# Patient Record
Sex: Female | Born: 1940 | Race: White | Hispanic: No | Marital: Married | Smoking: Current every day smoker
Health system: Southern US, Community
[De-identification: ages and names within clinical notes are randomized; demographics above are authoritative.]

## PROBLEM LIST (undated history)

## (undated) DIAGNOSIS — Z972 Presence of dental prosthetic device (complete) (partial): Secondary | ICD-10-CM

## (undated) DIAGNOSIS — J40 Bronchitis, not specified as acute or chronic: Secondary | ICD-10-CM

---

## 1972-07-07 HISTORY — PX: BREAST ENHANCEMENT SURGERY: SHX7

## 1996-07-07 HISTORY — PX: FACIAL COSMETIC SURGERY: SHX629

## 2008-12-19 ENCOUNTER — Ambulatory Visit: Payer: Self-pay | Admitting: Family Medicine

## 2016-10-15 ENCOUNTER — Telehealth: Payer: Self-pay

## 2016-10-15 ENCOUNTER — Other Ambulatory Visit: Payer: Self-pay

## 2016-10-15 ENCOUNTER — Telehealth: Payer: Self-pay | Admitting: Gastroenterology

## 2016-10-15 DIAGNOSIS — Z1211 Encounter for screening for malignant neoplasm of colon: Secondary | ICD-10-CM

## 2016-10-15 NOTE — Telephone Encounter (Signed)
10/15/16 Spoke with Gavin Pound. At [facility] and NO prior Josem Kaufmann is required for Screening Colonoscopy [phone] / Z12.11 for Dr. Allen Norris at [facility] Ref # [id number].

## 2016-10-15 NOTE — Telephone Encounter (Signed)
Gastroenterology Pre-Procedure Review  Request Date: 10/27/16 Requesting Physician: Dr. Allen Norris  PATIENT REVIEW QUESTIONS: The patient responded to the following health history questions as indicated:    1. Are you having any GI issues? yes (Blood in stool) 2. Do you have a personal history of Polyps? no 3. Do you have a family history of Colon Cancer or Polyps? no 4. Diabetes Mellitus? no 5. Joint replacements in the past 12 months?no 6. Major health problems in the past 3 months?no 7. Any artificial heart valves, MVP, or defibrillator?no    MEDICATIONS & ALLERGIES:    Patient reports the following regarding taking any anticoagulation/antiplatelet therapy:   Plavix, Coumadin, Eliquis, Xarelto, Lovenox, Pradaxa, Brilinta, or Effient? no Aspirin? no  Patient confirms/reports the following medications:  No current outpatient prescriptions on file.   No current facility-administered medications for this visit.     Patient confirms/reports the following allergies:  Allergies not on file  No orders of the defined types were placed in this encounter.   AUTHORIZATION INFORMATION Primary Insurance: 1D#: Group #:  Secondary Insurance: 1D#: Group #:  SCHEDULE INFORMATION: Date: 4/23 Time: Location: [place]

## 2016-10-16 DIAGNOSIS — J40 Bronchitis, not specified as acute or chronic: Secondary | ICD-10-CM

## 2016-10-16 HISTORY — DX: Bronchitis, not specified as acute or chronic: J40

## 2016-10-22 ENCOUNTER — Encounter: Payer: Self-pay | Admitting: *Deleted

## 2016-10-24 NOTE — Discharge Instructions (Signed)

## 2016-10-27 ENCOUNTER — Ambulatory Visit: Payer: Medicare HMO | Admitting: Anesthesiology

## 2016-10-27 ENCOUNTER — Ambulatory Visit
Admission: RE | Admit: 2016-10-27 | Discharge: 2016-10-27 | Disposition: A | Discharge status: Home / Self Care | Payer: Medicare HMO | Source: Ambulatory Visit | Attending: Gastroenterology | Admitting: Gastroenterology

## 2016-10-27 ENCOUNTER — Encounter
Admission: RE | Disposition: A | Discharge status: Home / Self Care | Payer: Self-pay | Source: Ambulatory Visit | Attending: Gastroenterology

## 2016-10-27 DIAGNOSIS — K449 Diaphragmatic hernia without obstruction or gangrene: Secondary | ICD-10-CM | POA: Diagnosis not present

## 2016-10-27 DIAGNOSIS — D122 Benign neoplasm of ascending colon: Secondary | ICD-10-CM | POA: Insufficient documentation

## 2016-10-27 DIAGNOSIS — K635 Polyp of colon: Secondary | ICD-10-CM

## 2016-10-27 DIAGNOSIS — K641 Second degree hemorrhoids: Secondary | ICD-10-CM | POA: Insufficient documentation

## 2016-10-27 DIAGNOSIS — D124 Benign neoplasm of descending colon: Secondary | ICD-10-CM | POA: Diagnosis not present

## 2016-10-27 DIAGNOSIS — Z1211 Encounter for screening for malignant neoplasm of colon: Secondary | ICD-10-CM | POA: Diagnosis not present

## 2016-10-27 DIAGNOSIS — R1310 Dysphagia, unspecified: Secondary | ICD-10-CM | POA: Diagnosis not present

## 2016-10-27 DIAGNOSIS — D125 Benign neoplasm of sigmoid colon: Secondary | ICD-10-CM

## 2016-10-27 DIAGNOSIS — K222 Esophageal obstruction: Secondary | ICD-10-CM | POA: Insufficient documentation

## 2016-10-27 DIAGNOSIS — K621 Rectal polyp: Secondary | ICD-10-CM | POA: Diagnosis not present

## 2016-10-27 DIAGNOSIS — F17200 Nicotine dependence, unspecified, uncomplicated: Secondary | ICD-10-CM | POA: Diagnosis not present

## 2016-10-27 HISTORY — PX: ESOPHAGOGASTRODUODENOSCOPY (EGD) WITH PROPOFOL: SHX5813

## 2016-10-27 HISTORY — PX: COLONOSCOPY WITH PROPOFOL: SHX5780

## 2016-10-27 HISTORY — DX: Bronchitis, not specified as acute or chronic: J40

## 2016-10-27 HISTORY — PX: ESOPHAGEAL DILATION: SHX303

## 2016-10-27 HISTORY — DX: Presence of dental prosthetic device (complete) (partial): Z97.2

## 2016-10-27 HISTORY — PX: POLYPECTOMY: SHX5525

## 2016-10-27 SURGERY — COLONOSCOPY WITH PROPOFOL
Anesthesia: Monitor Anesthesia Care | Wound class: Contaminated

## 2016-10-27 MED ORDER — GLYCOPYRROLATE 0.2 MG/ML IJ SOLN
INTRAMUSCULAR | Status: DC | PRN
  Administered 2016-10-27: 0.1 mg via INTRAVENOUS

## 2016-10-27 MED ORDER — LACTATED RINGERS IV SOLN
INTRAVENOUS | Status: DC | PRN
  Administered 2016-10-27: 08:00:00 via INTRAVENOUS

## 2016-10-27 MED ORDER — STERILE WATER FOR IRRIGATION IR SOLN
Status: DC | PRN
  Administered 2016-10-27: 09:00:00

## 2016-10-27 MED ORDER — LIDOCAINE HCL (CARDIAC) 20 MG/ML IV SOLN
INTRAVENOUS | Status: DC | PRN
Start: 2016-10-27 — End: 2016-10-27
  Administered 2016-10-27: 30 mg via INTRAVENOUS

## 2016-10-27 MED ORDER — PROPOFOL 10 MG/ML IV BOLUS
INTRAVENOUS | Status: DC | PRN
  Administered 2016-10-27: 30 mg via INTRAVENOUS
  Administered 2016-10-27: 100 mg via INTRAVENOUS
  Administered 2016-10-27: 50 mg via INTRAVENOUS
  Administered 2016-10-27: 20 mg via INTRAVENOUS
  Administered 2016-10-27: 30 mg via INTRAVENOUS
  Administered 2016-10-27: 50 mg via INTRAVENOUS

## 2016-10-27 SURGICAL SUPPLY — 26 items
BALLN DILATOR 15-18 8 (BALLOONS) ×4
BALLOON DILATOR 15-18 8 (BALLOONS) ×2 IMPLANT
CANISTER SUCT 1200ML W/VALVE (MISCELLANEOUS) ×4 IMPLANT
CLIP HMST 235XBRD CATH ROT (MISCELLANEOUS) IMPLANT
CLIP RESOLUTION 360 11X235 (MISCELLANEOUS)
FCP ESCP3.2XJMB 240X2.8X (MISCELLANEOUS)
FORCEPS BIOP RAD 4 LRG CAP 4 (CUTTING FORCEPS) IMPLANT
FORCEPS BIOP RJ4 240 W/NDL (MISCELLANEOUS)
FORCEPS ESCP3.2XJMB 240X2.8X (MISCELLANEOUS) IMPLANT
GOWN CVR UNV OPN BCK APRN NK (MISCELLANEOUS) ×4 IMPLANT
GOWN ISOL THUMB LOOP REG UNIV (MISCELLANEOUS) ×4
INJECTOR VARIJECT VIN23 (MISCELLANEOUS) IMPLANT
KIT DEFENDO VALVE AND CONN (KITS) IMPLANT
KIT ENDO PROCEDURE OLY (KITS) ×4 IMPLANT
MARKER SPOT ENDO TATTOO 5ML (MISCELLANEOUS) IMPLANT
PAD GROUND ADULT SPLIT (MISCELLANEOUS) IMPLANT
PROBE APC STR FIRE (PROBE) IMPLANT
RETRIEVER NET ROTH 2.5X230 LF (MISCELLANEOUS) IMPLANT
SNARE SHORT THROW 13M SML OVAL (MISCELLANEOUS) ×4 IMPLANT
SNARE SHORT THROW 30M LRG OVAL (MISCELLANEOUS) IMPLANT
SNARE SNG USE RND 15MM (INSTRUMENTS) IMPLANT
SPOT EX ENDOSCOPIC TATTOO (MISCELLANEOUS)
SYR INFLATION 60ML (SYRINGE) ×4 IMPLANT
TRAP ETRAP POLY (MISCELLANEOUS) ×4 IMPLANT
VARIJECT INJECTOR VIN23 (MISCELLANEOUS)
WATER STERILE IRR 250ML POUR (IV SOLUTION) ×4 IMPLANT

## 2016-10-27 NOTE — H&P (Signed)
   Lucilla Lame, MD [facility] [address], [place] [address] Phone:[phone] Fax : [phone]  Primary Care Physician:  Crystal Jude, MD Primary Gastroenterologist:  Dr. Allen Johnson  Pre-Procedure History & Physical: HPI:  Crystal Johnson is a 76 y.o. female is here for an endoscopy and colonoscopy.   Past Medical History:  Diagnosis Date  . Bronchitis 10/16/2016  . Wears dentures    full upper and lower    Past Surgical History:  Procedure Laterality Date  . BREAST ENHANCEMENT SURGERY Bilateral 1974  . [place]    Prior to Admission medications   Medication Sig Start Date End Date Taking? Authorizing Provider  albuterol (PROVENTIL HFA;VENTOLIN HFA) 108 (90 Base) MCG/ACT inhaler Inhale into the lungs every 6 (six) hours as needed for wheezing or shortness of breath.   Yes Historical Provider, MD  Coenzyme Q10 (COQ10 PO) Take by mouth daily.   Yes Historical Provider, MD  diazepam (VALIUM) 5 MG tablet  09/10/16  Yes Historical Provider, MD  folic acid (FOLVITE) 1 MG tablet  10/08/16  Yes Historical Provider, MD  lovastatin (MEVACOR) 20 MG tablet Take 20 mg by mouth at bedtime.   Yes Historical Provider, MD  SPIRIVA RESPIMAT 1.25 MCG/ACT AERS  09/10/16  Yes Historical Provider, MD    Allergies as of 10/15/2016  . (No Known Allergies)    History reviewed. No pertinent family history.  Social History   Social History  . Marital status: Married    Spouse name: N/A  . Number of children: N/A  . Years of education: N/A   Occupational History  . Not on file.   Social History Main Topics  . Smoking status: Current Every Day Smoker    Packs/day: 1.00    Years: 45.00    Types: Cigarettes  . Smokeless tobacco: Never Used     Comment: started age 89  . Alcohol use No  . Drug use: Unknown  . Sexual activity: Not on file   Other Topics Concern  . Not on file   Social History Narrative  . No narrative on file    Review of  Systems: See HPI, otherwise negative ROS  Physical Exam: BP (!) 147/96   Pulse 88   Temp 97.5 F (36.4 C) (Temporal)   Ht 5\' 6"  (1.676 m)   Wt 143 lb (64.9 kg)   SpO2 96%   BMI 23.08 kg/m  General:   Alert,  pleasant and cooperative in NAD Head:  Normocephalic and atraumatic. Neck:  Supple; no masses or thyromegaly. Lungs:  Clear throughout to auscultation.    Heart:  Regular rate and rhythm. Abdomen:  Soft, nontender and nondistended. Normal bowel sounds, without guarding, and without rebound.   Neurologic:  Alert and  oriented x4;  grossly normal neurologically.  Impression/Plan: Crystal Johnson is here for an endoscopy and colonoscopy to be performed for Screening and dysphagia  Risks, benefits, limitations, and alternatives regarding  endoscopy and colonoscopy have been reviewed with the patient.  Questions have been answered.  All parties agreeable.   Lucilla Lame, MD  10/27/2016, 7:46 AM

## 2016-10-27 NOTE — Anesthesia Postprocedure Evaluation (Signed)
Anesthesia Post Note  Patient: Crystal Johnson  Procedure(s) Performed: Procedure(s) (LRB): COLONOSCOPY WITH PROPOFOL (N/A) ESOPHAGOGASTRODUODENOSCOPY (EGD) WITH PROPOFOL ESOPHAGEAL DILATION POLYPECTOMY  Patient location during evaluation: PACU Anesthesia Type: MAC Level of consciousness: awake and alert Pain management: pain level controlled Vital Signs Assessment: post-procedure vital signs reviewed and stable Respiratory status: spontaneous breathing, nonlabored ventilation and respiratory function stable Cardiovascular status: stable Postop Assessment: no signs of nausea or vomiting Anesthetic complications: no    Veda Canning

## 2016-10-27 NOTE — Anesthesia Preprocedure Evaluation (Signed)
Anesthesia Evaluation  Patient identified by MRN, date of birth, ID band Patient awake    Reviewed: Allergy & Precautions  Airway Mallampati: I  TM Distance: >3 FB     Dental   Pulmonary Current Smoker,  Recent bronchitis   breath sounds clear to auscultation       Cardiovascular negative cardio ROS   Rhythm:regular Rate:Normal     Neuro/Psych    GI/Hepatic negative GI ROS,   Endo/Other    Renal/GU      Musculoskeletal   Abdominal   Peds  Hematology   Anesthesia Other Findings   Reproductive/Obstetrics                            Anesthesia Physical Anesthesia Plan  ASA: II  Anesthesia Plan: MAC   Post-op Pain Management:    Induction:   Airway Management Planned:   Additional Equipment:   Intra-op Plan:   Post-operative Plan:   Informed Consent: I have reviewed the patients History and Physical, chart, labs and discussed the procedure including the risks, benefits and alternatives for the proposed anesthesia with the patient or authorized representative who has indicated his/her understanding and acceptance.     Plan Discussed with: CRNA  Anesthesia Plan Comments:         Anesthesia Quick Evaluation

## 2016-10-27 NOTE — Op Note (Signed)
[facility] Gastroenterology Patient Name: Crystal Johnson Procedure Date: 10/27/2016 8:17 AM MRN: [id number] Account #: [id number] Date of Birth: [birth year] Admit Type: Outpatient Age: 76 Room: [facility] OR ROOM 1 Gender: Female Note Status: Finalized Procedure:            Upper GI endoscopy Indications:          Dysphagia Providers:            Lucilla Lame MD, MD Referring MD:         Lynnell Jude (Referring MD) Medicines:            Propofol per Anesthesia Complications:        No immediate complications. Procedure:            Pre-Anesthesia Assessment:                       - Prior to the procedure, a History and Physical was                        performed, and patient medications and allergies were                        reviewed. The patient's tolerance of previous                        anesthesia was also reviewed. The risks and benefits of                        the procedure and the sedation options and risks were                        discussed with the patient. All questions were                        answered, and informed consent was obtained. Prior                        Anticoagulants: The patient has taken no previous                        anticoagulant or antiplatelet agents. ASA Grade                        Assessment: II - A patient with mild systemic disease.                        After reviewing the risks and benefits, the patient was                        deemed in satisfactory condition to undergo the                        procedure.                       After obtaining informed consent, the endoscope was                        passed under direct vision. Throughout the procedure,  the patient's blood pressure, pulse, and oxygen                        saturations were monitored continuously. The Olympus                        [id number]-HQ190 Endoscope (S#. [id number]) was introduced                        through the  mouth, and advanced to the second part of                        duodenum. The upper GI endoscopy was accomplished                        without difficulty. The patient tolerated the procedure                        well. Findings:      A medium-sized hiatal hernia was present.      One mild benign-appearing, intrinsic stenosis was found at the       gastroesophageal junction. And was traversed. A TTS dilator was passed       through the scope. Dilation with a 15-16.5-18 mm balloon dilator was       performed to 18 mm. The dilation site was examined following endoscope       reinsertion and showed complete resolution of luminal narrowing.      The stomach was normal.      The examined duodenum was normal. Impression:           - Medium-sized hiatal hernia.                       - Benign-appearing esophageal stenosis. Dilated.                       - Normal stomach.                       - Normal examined duodenum.                       - No specimens collected. Recommendation:       - Discharge patient to home.                       - Resume previous diet.                       - Continue present medications.                       - Perform a colonoscopy today. Procedure Code(s):    --- Professional ---                       [phone], Esophagogastroduodenoscopy, flexible, transoral;                        with transendoscopic balloon dilation of esophagus                        (less than 30 mm diameter) Diagnosis Code(s):    --- Professional ---  R13.10, Dysphagia, unspecified                       K22.2, Esophageal obstruction CPT copyright 2016 American Medical Association. All rights reserved. The codes documented in this report are preliminary and upon coder review may  be revised to meet current compliance requirements. Lucilla Lame MD, MD 10/27/2016 8:39:03 AM This report has been signed electronically. Number of Addenda: 0 Note Initiated On: 10/27/2016 8:17  AM Total Procedure Duration: 0 hours 3 minutes 53 seconds       [facility]

## 2016-10-27 NOTE — Anesthesia Procedure Notes (Signed)
Procedure Name: MAC Performed by: Santiaga Butzin Pre-anesthesia Checklist: Patient identified, Emergency Drugs available, Suction available, Timeout performed and Patient being monitored Patient Re-evaluated:Patient Re-evaluated prior to inductionOxygen Delivery Method: Nasal cannula Placement Confirmation: positive ETCO2     

## 2016-10-27 NOTE — Op Note (Signed)
[facility] Gastroenterology Patient Name: Crystal Johnson Procedure Date: 10/27/2016 8:24 AM MRN: [id number] Account #: [id number] Date of Birth: February 11, 1941 Admit Type: Outpatient Age: 76 Room: [facility] OR ROOM 1 Gender: Female Note Status: Finalized Procedure:            Colonoscopy Indications:          Screening for colorectal malignant neoplasm Providers:            Lucilla Lame MD, MD Referring MD:         Lynnell Jude (Referring MD) Medicines:            Propofol per Anesthesia Complications:        No immediate complications. Procedure:            Pre-Anesthesia Assessment:                       - Prior to the procedure, a History and Physical was                        performed, and patient medications and allergies were                        reviewed. The patient's tolerance of previous                        anesthesia was also reviewed. The risks and benefits of                        the procedure and the sedation options and risks were                        discussed with the patient. All questions were                        answered, and informed consent was obtained. Prior                        Anticoagulants: The patient has taken no previous                        anticoagulant or antiplatelet agents. ASA Grade                        Assessment: II - A patient with mild systemic disease.                        After reviewing the risks and benefits, the patient was                        deemed in satisfactory condition to undergo the                        procedure.                       After obtaining informed consent, the colonoscope was                        passed under direct vision. Throughout the procedure,  the patient's blood pressure, pulse, and oxygen                        saturations were monitored continuously. The [place] [id number]) was introduced through the                     anus and advanced to the the cecum, identified by                        appendiceal orifice and ileocecal valve. The                        colonoscopy was performed without difficulty. The                        patient tolerated the procedure well. The quality of                        the bowel preparation was excellent. Findings:      The perianal and digital rectal examinations were normal.      A 7 mm polyp was found in the ascending colon. The polyp was sessile.       The polyp was removed with a cold snare. Resection and retrieval were       complete.      A 3 mm polyp was found in the descending colon. The polyp was sessile.       The polyp was removed with a cold biopsy forceps. Resection and       retrieval were complete.      Two sessile polyps were found in the rectum. The polyps were 2 to 3 mm       in size. These polyps were removed with a cold biopsy forceps. Resection       and retrieval were complete.      Four sessile polyps were found in the sigmoid colon. The polyps were 2       to 3 mm in size. These polyps were removed with a cold biopsy forceps.       Resection and retrieval were complete.      Non-bleeding internal hemorrhoids were found during retroflexion. The       hemorrhoids were Grade II (internal hemorrhoids that prolapse but reduce       spontaneously). Impression:           - One 7 mm polyp in the ascending colon, removed with a                        cold snare. Resected and retrieved.                       - One 3 mm polyp in the descending colon, removed with                        a cold biopsy forceps. Resected and retrieved.                       - Two 2 to 3 mm polyps in the rectum, removed with a  cold biopsy forceps. Resected and retrieved.                       - Four 2 to 3 mm polyps in the sigmoid colon, removed                        with a cold biopsy forceps. Resected and retrieved.                        - Non-bleeding internal hemorrhoids. Recommendation:       - Discharge patient to home.                       - Resume previous diet.                       - Continue present medications.                       - Await pathology results. Procedure Code(s):    --- Professional ---                       [phone], Colonoscopy, flexible; with removal of tumor(s),                        polyp(s), or other lesion(s) by snare technique                       45380, 78, Colonoscopy, flexible; with biopsy, single                        or multiple Diagnosis Code(s):    --- Professional ---                       Z12.11, Encounter for screening for malignant neoplasm                        of colon                       D12.2, Benign neoplasm of ascending colon                       D12.4, Benign neoplasm of descending colon                       K62.1, Rectal polyp                       D12.5, Benign neoplasm of sigmoid colon CPT copyright 2016 American Medical Association. All rights reserved. The codes documented in this report are preliminary and upon coder review may  be revised to meet current compliance requirements. Lucilla Lame MD, MD 10/27/2016 8:55:44 AM This report has been signed electronically. Number of Addenda: 0 Note Initiated On: 10/27/2016 8:24 AM Scope Withdrawal Time: 0 hours 9 minutes 15 seconds  Total Procedure Duration: 0 hours 11 minutes 24 seconds       [facility]

## 2016-10-27 NOTE — Transfer of Care (Signed)
Immediate Anesthesia Transfer of Care Note  Patient: Crystal Johnson  Procedure(s) Performed: Procedure(s): COLONOSCOPY WITH PROPOFOL (N/A) ESOPHAGOGASTRODUODENOSCOPY (EGD) WITH PROPOFOL ESOPHAGEAL DILATION POLYPECTOMY  Patient Location: PACU  Anesthesia Type: MAC  Level of Consciousness: awake, alert  and patient cooperative  Airway and Oxygen Therapy: Patient Spontanous Breathing and Patient connected to supplemental oxygen  Post-op Assessment: Post-op Vital signs reviewed, Patient's Cardiovascular Status Stable, Respiratory Function Stable, Patent Airway and No signs of Nausea or vomiting  Post-op Vital Signs: Reviewed and stable  Complications: No apparent anesthesia complications

## 2016-10-28 ENCOUNTER — Encounter: Payer: Self-pay | Admitting: Gastroenterology

## 2016-10-31 ENCOUNTER — Encounter: Payer: Self-pay | Admitting: Gastroenterology

## 2016-11-03 ENCOUNTER — Encounter: Payer: Self-pay | Admitting: Gastroenterology

## 2016-11-03 ENCOUNTER — Other Ambulatory Visit: Payer: Self-pay

## 2017-01-08 ENCOUNTER — Ambulatory Visit
Admission: RE | Admit: 2017-01-08 | Discharge: 2017-01-08 | Disposition: A | Discharge status: Home / Self Care | Payer: Medicare HMO | Source: Ambulatory Visit | Attending: Family Medicine | Admitting: Family Medicine

## 2017-01-08 ENCOUNTER — Other Ambulatory Visit: Payer: Self-pay | Admitting: Family Medicine

## 2017-01-08 DIAGNOSIS — R05 Cough: Secondary | ICD-10-CM | POA: Diagnosis not present

## 2017-01-08 DIAGNOSIS — R059 Cough, unspecified: Secondary | ICD-10-CM

## 2019-08-09 ENCOUNTER — Other Ambulatory Visit: Payer: Self-pay | Admitting: Family Medicine

## 2019-08-09 ENCOUNTER — Ambulatory Visit
Admission: RE | Admit: 2019-08-09 | Discharge: 2019-08-09 | Disposition: A | Discharge status: Home / Self Care | Payer: Medicare HMO | Attending: Family Medicine | Admitting: Family Medicine

## 2019-08-09 ENCOUNTER — Ambulatory Visit
Admission: RE | Admit: 2019-08-09 | Discharge: 2019-08-09 | Disposition: A | Discharge status: Home / Self Care | Payer: Medicare HMO | Source: Ambulatory Visit | Attending: Family Medicine | Admitting: Family Medicine

## 2019-08-09 ENCOUNTER — Other Ambulatory Visit: Payer: Self-pay

## 2019-08-09 DIAGNOSIS — R059 Cough, unspecified: Secondary | ICD-10-CM

## 2019-08-09 DIAGNOSIS — R05 Cough: Secondary | ICD-10-CM

## 2021-06-04 ENCOUNTER — Ambulatory Visit
Admission: RE | Admit: 2021-06-04 | Discharge: 2021-06-04 | Disposition: A | Discharge status: Home / Self Care | Payer: Medicare HMO | Source: Ambulatory Visit | Attending: Internal Medicine | Admitting: Internal Medicine

## 2021-06-04 ENCOUNTER — Other Ambulatory Visit: Payer: Self-pay

## 2021-06-04 ENCOUNTER — Ambulatory Visit (INDEPENDENT_AMBULATORY_CARE_PROVIDER_SITE_OTHER): Payer: Medicare HMO

## 2021-06-04 VITALS — BP 149/82 | HR 95 | Temp 98.6°F | Resp 20

## 2021-06-04 DIAGNOSIS — M25551 Pain in right hip: Secondary | ICD-10-CM

## 2021-06-04 DIAGNOSIS — M545 Low back pain, unspecified: Secondary | ICD-10-CM | POA: Diagnosis not present

## 2021-06-04 DIAGNOSIS — W19XXXA Unspecified fall, initial encounter: Secondary | ICD-10-CM

## 2021-06-04 DIAGNOSIS — M533 Sacrococcygeal disorders, not elsewhere classified: Secondary | ICD-10-CM

## 2021-06-04 DIAGNOSIS — Z9181 History of falling: Secondary | ICD-10-CM

## 2021-06-04 MED ORDER — DICLOFENAC SODIUM 1 % EX GEL: 2.0000 g | Freq: Two times a day (BID) | CUTANEOUS | 0 refills | Status: AC | PRN

## 2021-06-04 NOTE — ED Provider Notes (Signed)
[facility]    CSN: [id number] Arrival date & time: 06/04/21  1453      History   Chief Complaint Chief Complaint  Patient presents with   Hip Pain    right   Appt@3     HPI Crystal Johnson is a 80 y.o. female.   HPI  Past Medical History:  Diagnosis Date   Bronchitis 10/16/2016   Wears dentures    full upper and lower    Patient Active Problem List   Diagnosis Date Noted   Special screening for malignant neoplasms, colon    Benign neoplasm of descending colon    Polyp of sigmoid colon    Benign neoplasm of ascending colon    Problems with swallowing and mastication    Stricture and stenosis of esophagus     Past Surgical History:  Procedure Laterality Date   BREAST ENHANCEMENT SURGERY Bilateral 1974   COLONOSCOPY WITH PROPOFOL N/A 10/27/2016   Procedure: COLONOSCOPY WITH PROPOFOL;  Surgeon: Lucilla Lame, MD;  Location: [place];  Service: Endoscopy;  Laterality: N/A;   ESOPHAGEAL DILATION  10/27/2016   Procedure: ESOPHAGEAL DILATION;  Surgeon: Lucilla Lame, MD;  Location: [place];  Service: Endoscopy;;   ESOPHAGOGASTRODUODENOSCOPY (EGD) WITH PROPOFOL  10/27/2016   Procedure: ESOPHAGOGASTRODUODENOSCOPY (EGD) WITH PROPOFOL;  Surgeon: Lucilla Lame, MD;  Location: [place];  Service: Endoscopy;;   [place]   POLYPECTOMY  10/27/2016   Procedure: POLYPECTOMY;  Surgeon: Lucilla Lame, MD;  Location: [place];  Service: Endoscopy;;    OB History   No obstetric history on file.      Home Medications    Prior to Admission medications   Medication Sig Start Date End Date Taking? Authorizing Provider  albuterol (PROVENTIL HFA;VENTOLIN HFA) 108 (90 Base) MCG/ACT inhaler Inhale into the lungs every 6 (six) hours as needed for wheezing or shortness of breath.    [provider]  Coenzyme Q10 (COQ10 PO) Take by mouth daily.    [provider]  diazepam (VALIUM) 5 MG tablet   09/10/16   [provider]  folic acid (FOLVITE) 1 MG tablet  10/08/16   [provider]  lovastatin (MEVACOR) 20 MG tablet Take 20 mg by mouth at bedtime.    [provider]  SPIRIVA RESPIMAT 1.25 MCG/ACT AERS  09/10/16   [provider]    Family History History reviewed. No pertinent family history.  Social History Social History   Tobacco Use   Smoking status: Every Day    Packs/day: 1.00    Years: 45.00    Pack years: 45.00    Types: Cigarettes   Smokeless tobacco: Never   Tobacco comments:    started age 28  Substance Use Topics   Alcohol use: No     Allergies   Patient has no known allergies.   Review of Systems Review of Systems   Physical Exam Triage Vital Signs ED Triage Vitals  Enc Vitals Group     BP 06/04/21 1525 (!) 149/82     Pulse Rate 06/04/21 1525 95     Resp 06/04/21 1525 20     Temp 06/04/21 1525 98.6 F (37 C)     Temp Source 06/04/21 1525 Oral     SpO2 06/04/21 1525 98 %     Weight --      Height --      Head Circumference --      Peak Flow --  Pain Score 06/04/21 1521 8     Pain Loc --      Pain Edu? --      Excl. in [place]? --    No data found.  Updated Vital Signs BP (!) 149/82 (BP Location: Left Arm)   Pulse 95   Temp 98.6 F (37 C) (Oral)   Resp 20   SpO2 98%   Visual Acuity Right Eye Distance:   Left Eye Distance:   Bilateral Distance:    Right Eye Near:   Left Eye Near:    Bilateral Near:     Physical Exam   UC Treatments / Results  Labs (all labs ordered are listed, but only abnormal results are displayed) Labs Reviewed - No data to display  EKG   Radiology No results found.  Procedures Procedures (including critical care time)  Medications Ordered in UC Medications - No data to display  Initial Impression / Assessment and Plan / UC Course  I have reviewed the triage vital signs and the nursing notes.  Pertinent labs & imaging results that were available during  my care of the patient were reviewed by me and considered in my medical decision making (see chart for details).      Final Clinical Impressions(s) / UC Diagnoses   Final diagnoses:  Right hip pain  History of fall within past 90 days  Pain of right sacroiliac joint     Discharge Instructions      Xrays of your pelvis and low back were done at urgent care today; results are pending.  Staff will call you if there are any notable findings, and we will send results to Dr Clemmie Krill.  Exam of your hips did not suggest a broken hip today.  Keep followup appointments with the orthopedic care provider as arranged by Dr Clemmie Krill.  Diclofenac gel (an antiinflammatory/pain relieving rub) may be helpful to relieve pain.  Ice 10-15 minutes several times daily may also relieve pain, and physical therapy often is helpful in improving pain/mobility for painful conditions.   ED Prescriptions   None    PDMP not reviewed this encounter.   Margarette Canada, NP 06/13/21 [phone]

## 2021-06-04 NOTE — ED Triage Notes (Addendum)
Pt c/o of right hip pain since she slipped and fell onto hip on 05/06/21. She is ambulatory with discomfort. She took Ibuprofen 800 mg at 2:30 pm. Pain 8/10

## 2021-06-04 NOTE — Discharge Instructions (Addendum)
Xrays of your pelvis and low back were done at urgent care today; no fractures are seen although there are arthritic changes seen in the low back.  There is an incidental finding in the low back xray of a potentially enlarged aorta; this should be followed up by your primary care provider with a dedicated test (typically either ultrasound or CT scan) to measure the aorta and determine if enlargement is actually present.   Exam of your hips did not suggest a broken hip today.  Keep followup appointments with the orthopedic care provider as arranged by Dr Clemmie Krill.  Diclofenac gel (an antiinflammatory/pain relieving rub) may be helpful to relieve pain; a prescription was sent to the pharmacy.  Ice 10-15 minutes several times daily may also relieve pain, and physical therapy often is helpful in improving pain/mobility for painful conditions.

## 2021-06-04 NOTE — ED Provider Notes (Signed)
[facility]    CSN: [id number] Arrival date & time: 06/04/21  1453      History   Chief Complaint Chief Complaint  Patient presents with   Hip Pain    right   Appt@3     HPI Crystal Johnson is a 80 y.o. female. Having pain in R leg for several months.  Started with twisting R ankle in 08/2020, with subsequent pain in R shin, worked up with imaging in [place].  Saw a chiropractor; developed low back discomfort.  Slipped on 05/06/21 and fell onto R hip, catching self with right arm.  Subsequent pain in R lateral hip with intermittent tingling (esp after sitting) radiating from R thigh down R leg.  Able to walk.  Mildly constipated when taking hydrocodone for pain.  Mild difficulty starting urine stream.  Taking 'too much' ibuprofen.  PCP Dr Clemmie Krill set her up to see orthopedist 06/10/21.  Having a of discomfort still in right low back/right lateral hip--wanted it checked sooner.  No leg swelling.  No leg coldness.  Walked into urgent care; using a cane but gets around very well.    HPI  Past Medical History:  Diagnosis Date   Bronchitis 10/16/2016   Wears dentures    full upper and lower    Patient Active Problem List   Diagnosis Date Noted   Special screening for malignant neoplasms, colon    Benign neoplasm of descending colon    Polyp of sigmoid colon    Benign neoplasm of ascending colon    Problems with swallowing and mastication    Stricture and stenosis of esophagus     Past Surgical History:  Procedure Laterality Date   BREAST ENHANCEMENT SURGERY Bilateral 1974   COLONOSCOPY WITH PROPOFOL N/A 10/27/2016   Procedure: COLONOSCOPY WITH PROPOFOL;  Surgeon: Lucilla Lame, MD;  Location: [place];  Service: Endoscopy;  Laterality: N/A;   ESOPHAGEAL DILATION  10/27/2016   Procedure: ESOPHAGEAL DILATION;  Surgeon: Lucilla Lame, MD;  Location: [place];  Service: Endoscopy;;   ESOPHAGOGASTRODUODENOSCOPY (EGD) WITH PROPOFOL  10/27/2016   Procedure:  ESOPHAGOGASTRODUODENOSCOPY (EGD) WITH PROPOFOL;  Surgeon: Lucilla Lame, MD;  Location: [place];  Service: Endoscopy;;   [place]   POLYPECTOMY  10/27/2016   Procedure: POLYPECTOMY;  Surgeon: Lucilla Lame, MD;  Location: [place];  Service: Endoscopy;;    Home Medications    Prior to Admission medications   Medication Sig Start Date End Date Taking? Authorizing Provider  diclofenac Sodium (VOLTAREN) 1 % GEL Apply 2 g topically 2 (two) times daily as needed. 06/04/21  Yes Wynona Luna, MD  albuterol (PROVENTIL HFA;VENTOLIN HFA) 108 (90 Base) MCG/ACT inhaler Inhale into the lungs every 6 (six) hours as needed for wheezing or shortness of breath.    [provider]  Coenzyme Q10 (COQ10 PO) Take by mouth daily.    [provider]  diazepam (VALIUM) 5 MG tablet  09/10/16   [provider]  folic acid (FOLVITE) 1 MG tablet  10/08/16   [provider]  lovastatin (MEVACOR) 20 MG tablet Take 20 mg by mouth at bedtime.    [provider]  SPIRIVA RESPIMAT 1.25 MCG/ACT AERS  09/10/16   [provider]    Family History History reviewed. No pertinent family history.  Social History Social History   Tobacco Use   Smoking status: Every Day    Packs/day: 1.00    Years: 45.00  Pack years: 45.00    Types: Cigarettes   Smokeless tobacco: Never   Tobacco comments:    started age 6  Substance Use Topics   Alcohol use: No     Allergies   Patient has no known allergies.   Review of Systems Review of Systems  see HPI   Physical Exam Triage Vital Signs ED Triage Vitals  Enc Vitals Group     BP 06/04/21 1525 (!) 149/82     Pulse Rate 06/04/21 1525 95     Resp 06/04/21 1525 20     Temp 06/04/21 1525 98.6 F (37 C)     Temp Source 06/04/21 1525 Oral     SpO2 06/04/21 1525 98 %     Weight --      Height --      Pain Score 06/04/21 1521 8     Pain Loc --     Updated Vital Signs BP  (!) 149/82 (BP Location: Left Arm)   Pulse 95   Temp 98.6 F (37 C) (Oral)   Resp 20   SpO2 98%   Physical Exam Constitutional:      General: She is not in acute distress.    Appearance: She is not ill-appearing or toxic-appearing.     Comments: Nicely groomed  HENT:     Head: Atraumatic.  Eyes:     Conjunctiva/sclera:     Right eye: Right conjunctiva is not injected. No exudate.    Left eye: Left conjunctiva is not injected. No exudate.    Comments: Conjugate gaze; face symmetric  Cardiovascular:     Rate and Rhythm: Normal rate.  Pulmonary:     Effort: Pulmonary effort is normal. No respiratory distress.  Abdominal:     General: There is no distension.  Musculoskeletal:        General: No swelling.     Comments: Walked into urgent care using cane; able to climb on/off exam table without assistance ROM B hips symmetric, not painful Mildly tender to palpation right SI joint; no focal tenderness in midline  Skin:    General: Skin is warm and dry.     Comments: No cyanosis  Neurological:     Mental Status: She is alert.     Comments: Speech clear/coherent/logical     UC Treatments / Results  Labs (all labs ordered are listed, but only abnormal results are displayed) Labs Reviewed - No data to display Labs not done/indicated at urgent care today  Radiology DG Lumbar Spine 2-3 Views  Result Date: 06/04/2021 CLINICAL DATA:  Prolonged pain over RIGHT lateral hip area following fall on 05/06/2021. EXAM: LUMBAR SPINE - 2-3 VIEW COMPARISON:  None FINDINGS: Mild dextroconvexity in the setting of degenerative changes in the lumbar spine. Dextroconvexity centered about L2-3. Five lumbar type vertebral bodies. In the setting of degenerative changes in the spine there is 3 mm retrolisthesis of L2 on L3 with marked disc space narrowing at this level. The moderate disc space narrowing at L4-5 and moderate to marked disc space narrowing L5-S1. Facet arthropathy in the lower lumbar  spine. No signs of fracture. Signs of abdominal aortic aneurysm approximately 5 cm diameter as measured based on calcification that is seen anterior to the spine on the lateral projection. Perhaps is much as 5.4 cm as measured on the frontal projection. IMPRESSION: No evidence of acute fracture of the lumbar spine. Moderate to marked degenerative changes as described. Signs of abdominal aortic aneurysm potentially as large as 5.5  cm, based on pattern of calcifications. There may also be iliac dilation. In the current setting would suggest dedicated evaluation of the abdominal aorta with CT angiography. Correlate with symptoms to determine whether vascular cause could be considered for current back pain. No comparison is available. Electronically Signed   By: Zetta Bills M.D.   On: 06/04/2021 16:20   DG Pelvis 1-2 Views  Result Date: 06/04/2021 CLINICAL DATA:  In a 80 year old feet female presents with prolonged pain in the RIGHT lateral hip after fall on October 31st. A also pain over the RIGHT SI joint. EXAM: PELVIS - 1-2 VIEW COMPARISON:  No comparison is available. FINDINGS: Degenerative changes are incidentally noted in the lumbar spine, see dedicated report. No fracture of the bony pelvis. No visible fracture of the sacrum with some overlying stool and gas. Pubic rami are intact. LEFT and RIGHT hip are unremarkable on AP view only. No dedicated view of the hips is provided. IMPRESSION: No acute findings in the bony pelvis or hips. Only AP view is provided. No dedicated views of the hips are present. Electronically Signed   By: Zetta Bills M.D.   On: 06/04/2021 16:14    Procedures Procedures (including critical care time) None today  Medications Ordered in UC Medications - No data to display No meds given at urgent care today     Final Clinical Impressions(s) / UC Diagnoses   Final diagnoses:  Right hip pain  History of fall within past 90 days  Pain of right sacroiliac joint   Incidental finding on lumbar xray--potentially enlarged aorta.  Needs dedicated study to measure aorta--typically a CT or ultrasound.   Discharge Instructions      Xrays of your pelvis and low back were done at urgent care today; no fractures are seen although there are arthritic changes seen in the low back.  There is an incidental finding in the low back xray of a potentially enlarged aorta; this should be followed up by your primary care provider with a dedicated test (typically either ultrasound or CT scan) to measure the aorta and determine if enlargement is actually present.   Exam of your hips did not suggest a broken hip today.  Keep followup appointments with the orthopedic care provider as arranged by Dr Clemmie Krill.  Diclofenac gel (an antiinflammatory/pain relieving rub) may be helpful to relieve pain; a prescription was sent to the pharmacy.  Ice 10-15 minutes several times daily may also relieve pain, and physical therapy often is helpful in improving pain/mobility for painful conditions.   ED Prescriptions     Medication Sig Dispense Auth. Provider   diclofenac Sodium (VOLTAREN) 1 % GEL Apply 2 g topically 2 (two) times daily as needed. 50 g Wynona Luna, MD      PDMP not reviewed this encounter.   Wynona Luna, MD 06/04/21 [phone]

## 2021-06-21 ENCOUNTER — Other Ambulatory Visit: Payer: Self-pay | Admitting: Family Medicine

## 2021-06-21 DIAGNOSIS — R9389 Abnormal findings on diagnostic imaging of other specified body structures: Secondary | ICD-10-CM

## 2021-07-04 ENCOUNTER — Encounter (HOSPITAL_COMMUNITY): Payer: Self-pay | Admitting: Radiology

## 2021-07-19 ENCOUNTER — Ambulatory Visit: Payer: Medicare HMO

## 2021-09-20 ENCOUNTER — Other Ambulatory Visit: Payer: Self-pay | Admitting: Family Medicine

## 2021-09-23 ENCOUNTER — Other Ambulatory Visit: Payer: Self-pay | Admitting: Family Medicine

## 2021-09-24 ENCOUNTER — Other Ambulatory Visit: Payer: Self-pay | Admitting: Family Medicine

## 2021-09-24 ENCOUNTER — Other Ambulatory Visit (HOSPITAL_COMMUNITY): Payer: Self-pay | Admitting: Family Medicine

## 2021-09-24 DIAGNOSIS — R9389 Abnormal findings on diagnostic imaging of other specified body structures: Secondary | ICD-10-CM

## 2021-10-01 ENCOUNTER — Other Ambulatory Visit: Payer: Self-pay | Admitting: Family Medicine

## 2021-10-01 ENCOUNTER — Other Ambulatory Visit: Payer: Self-pay

## 2021-10-01 ENCOUNTER — Ambulatory Visit
Admission: RE | Admit: 2021-10-01 | Discharge: 2021-10-01 | Disposition: A | Discharge status: Home / Self Care | Payer: Medicare HMO | Source: Ambulatory Visit | Attending: Family Medicine | Admitting: Family Medicine

## 2021-10-01 DIAGNOSIS — R9389 Abnormal findings on diagnostic imaging of other specified body structures: Secondary | ICD-10-CM | POA: Insufficient documentation

## 2023-11-27 IMAGING — CR DG LUMBAR SPINE 2-3V
3 series · 3 of 3 positions shown · non-contrast
Comparison: None

CLINICAL DATA: Prolonged pain over RIGHT lateral hip area following
fall on 05/06/2021.

EXAM:
LUMBAR SPINE - 2-3 VIEW

[l-spine ap]
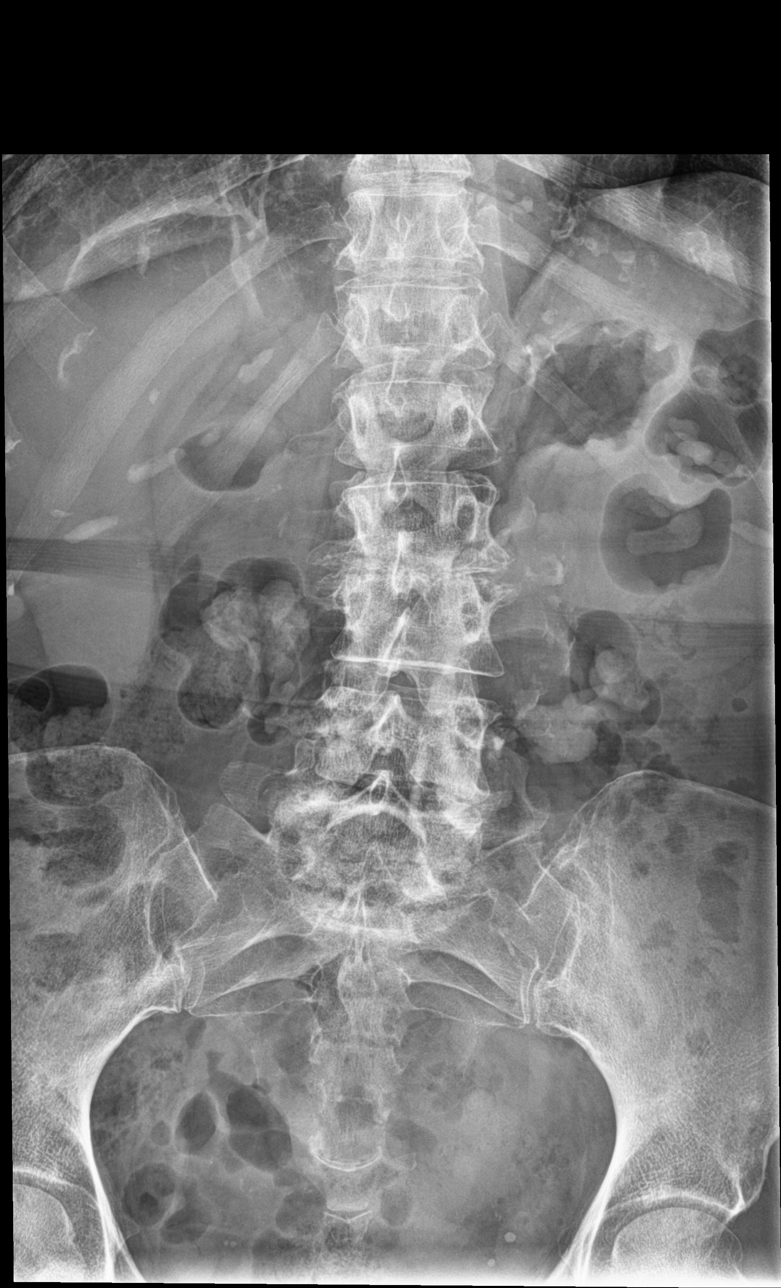

[l-spine lat]
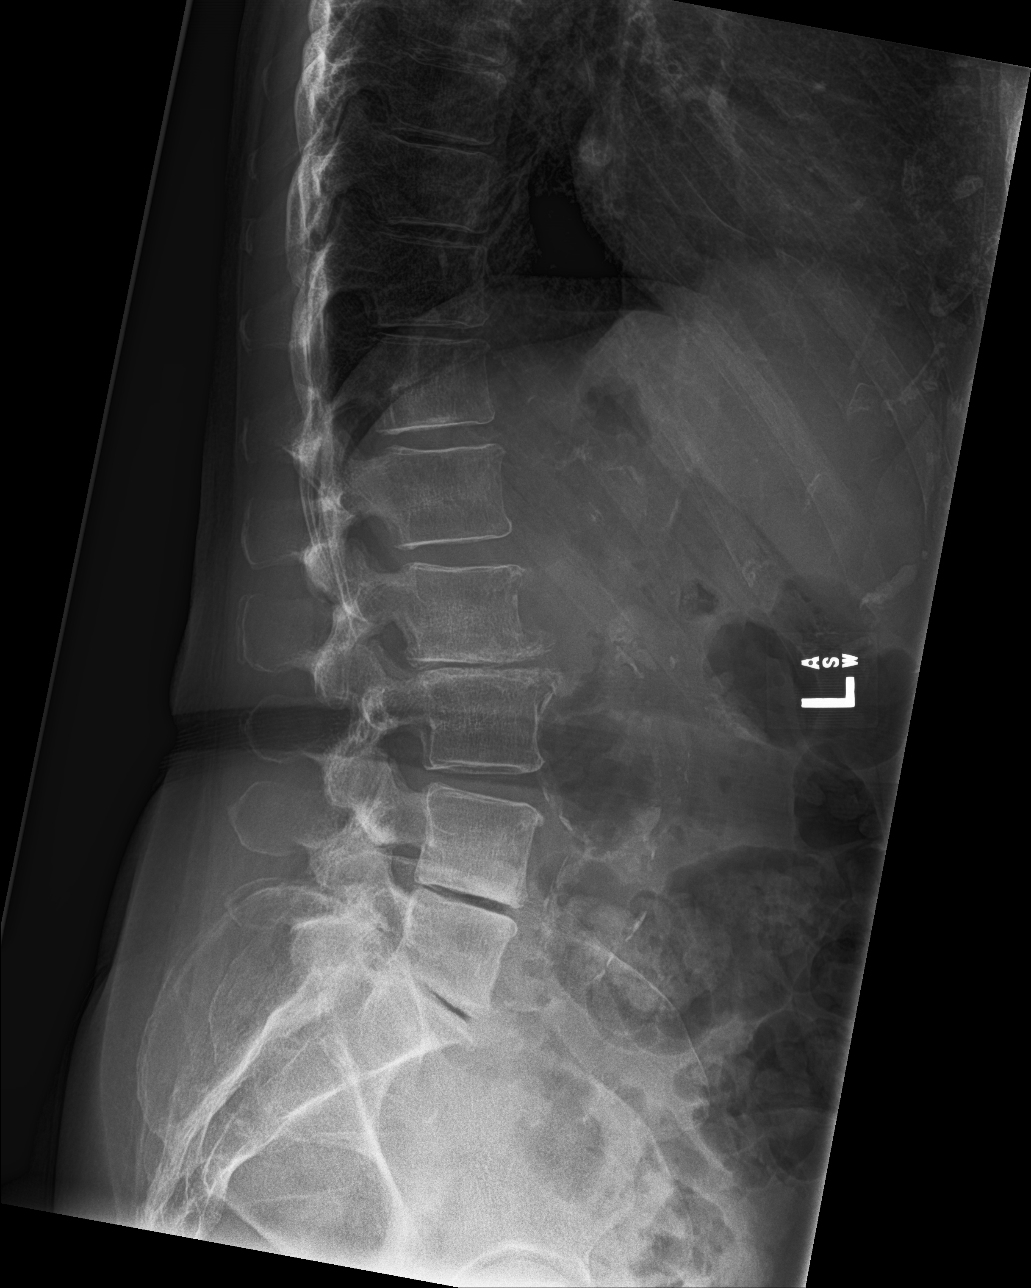

[l-spine spot]
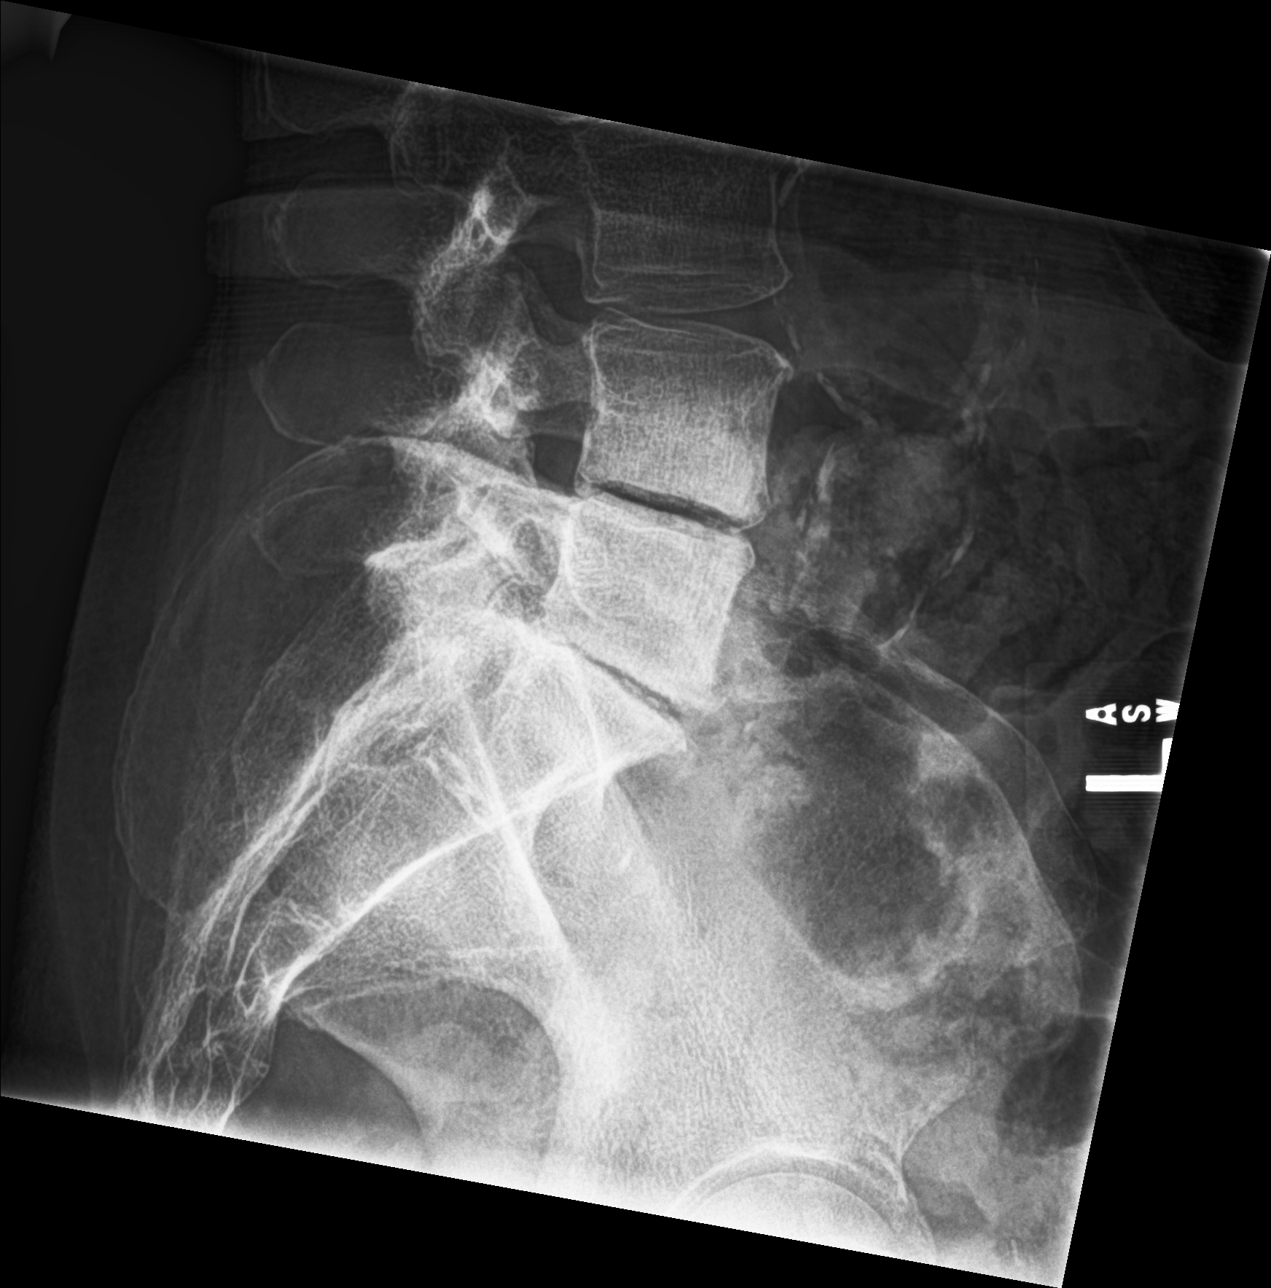

[3 of 3 positions shown; findings below may reference images not displayed]

FINDINGS: Mild dextroconvexity in the setting of degenerative changes in the
lumbar spine. Dextroconvexity centered about L2-3.

Five lumbar type vertebral bodies.

In the setting of degenerative changes in the spine there is 3 mm
retrolisthesis of L2 on L3 with marked disc space narrowing at this
level. The moderate disc space narrowing at L4-5 and moderate to
marked disc space narrowing L5-S1. Facet arthropathy in the lower
lumbar spine.

No signs of fracture.

Signs of abdominal aortic aneurysm approximately 5 cm diameter as
measured based on calcification that is seen anterior to the spine
on the lateral projection. Perhaps is much as 5.4 cm as measured on
the frontal projection.
IMPRESSION: No evidence of acute fracture of the lumbar spine.

Moderate to marked degenerative changes as described.

Signs of abdominal aortic aneurysm potentially as large as 5.5 cm,
based on pattern of calcifications. There may also be iliac
dilation. In the current setting would suggest dedicated evaluation
of the abdominal aorta with CT angiography. Correlate with symptoms
to determine whether vascular cause could be considered for current
back pain. No comparison is available.

## 2024-03-25 IMAGING — US US AORTA
1 series · 14 of 25 positions shown · non-contrast
Comparison: No prior ultrasound

CLINICAL DATA: AAA seen on x-ray

EXAM:
ULTRASOUND OF ABDOMINAL AORTA
TECHNIQUE: Ultrasound examination of the abdominal aorta and proximal common
iliac arteries was performed to evaluate for aneurysm. Additional
color and Doppler images of the distal aorta were obtained to
document patency.

[Series 1: us aorta · 0.22mm/px · 14 of 41 slices shown]
[im 1/41]
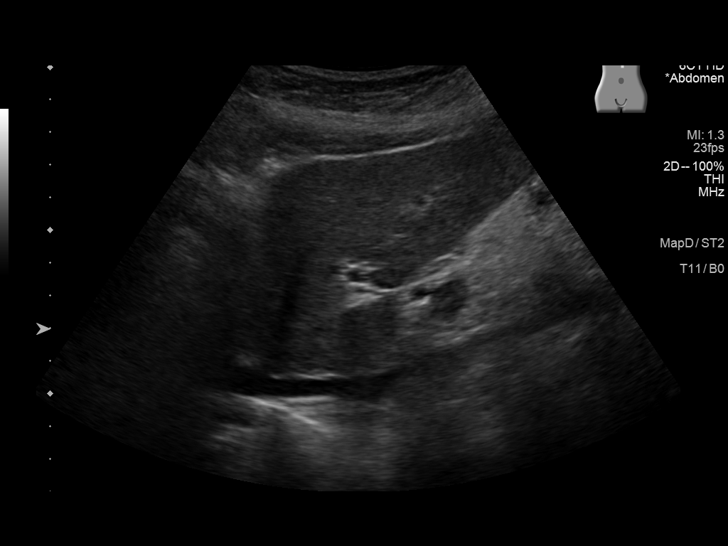
[im 4/41]
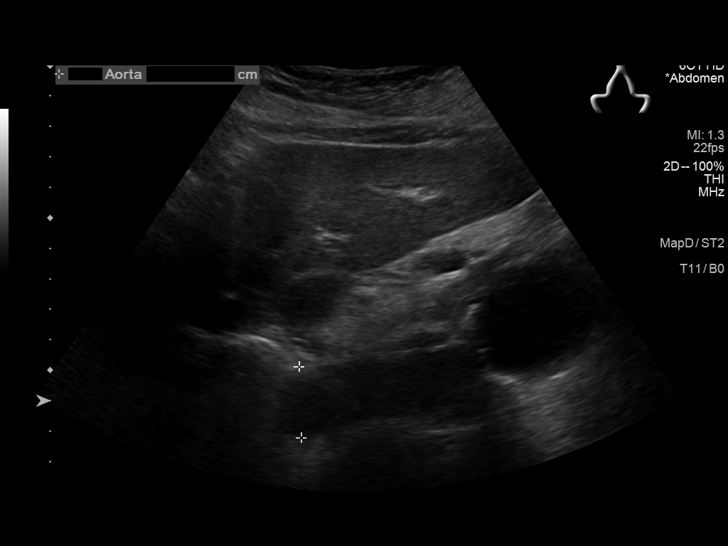
[im 7/41]
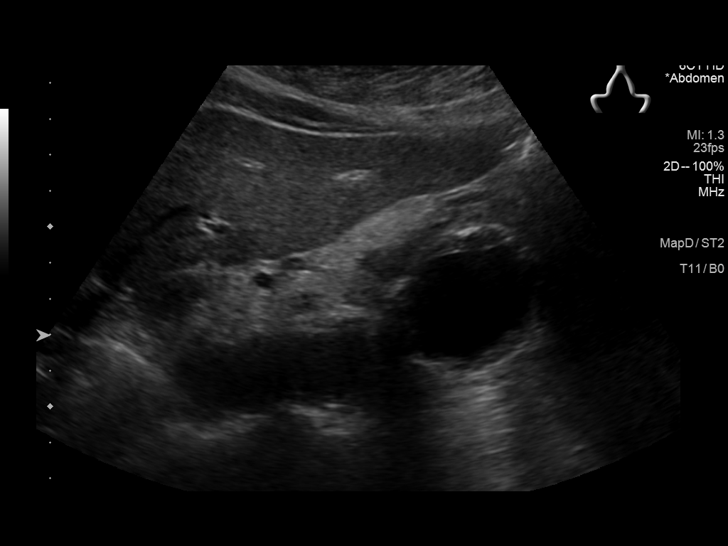
[im 11/41]
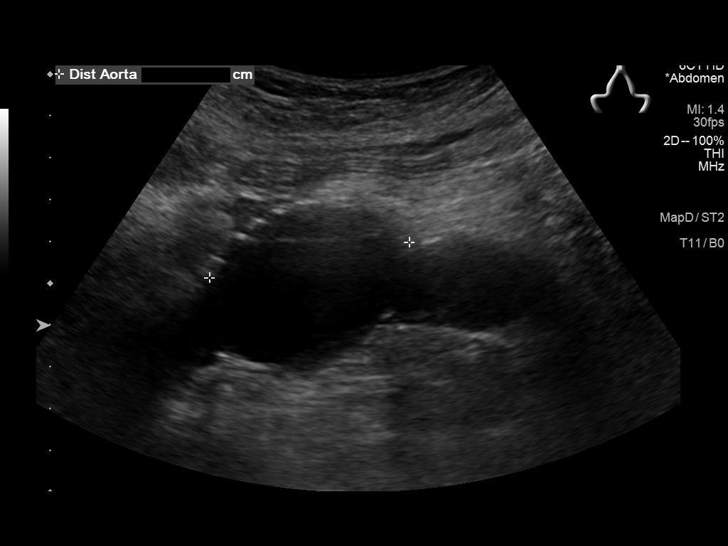
[im 14/41]
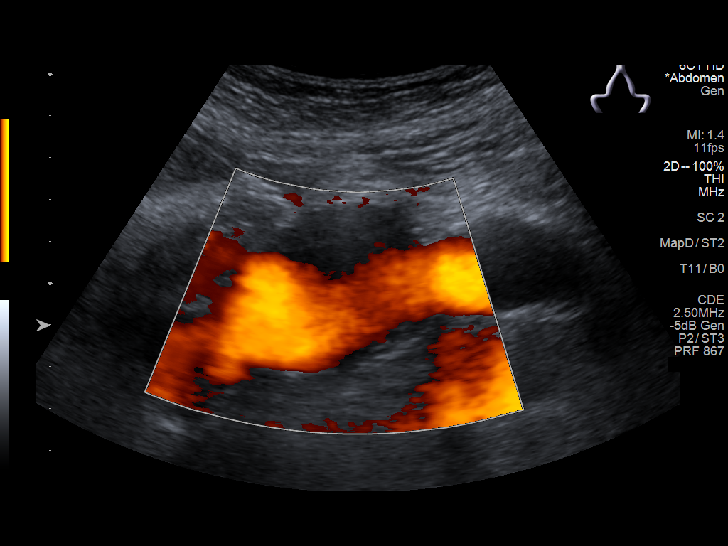
[im 16/41]
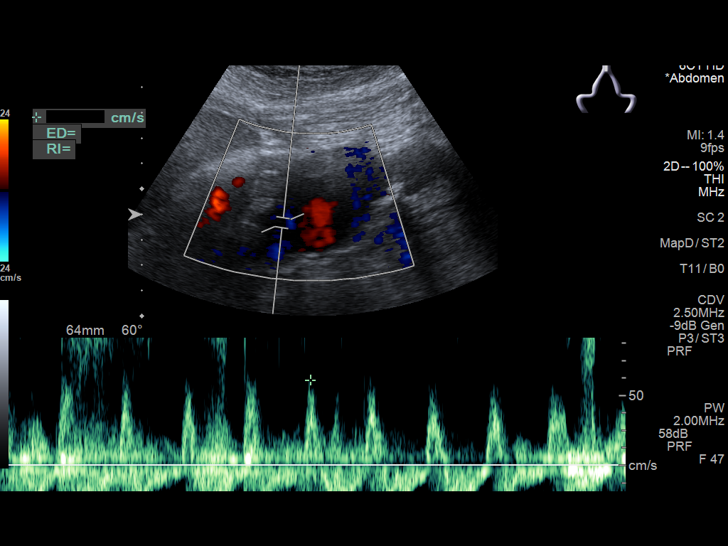
[im 19/41]
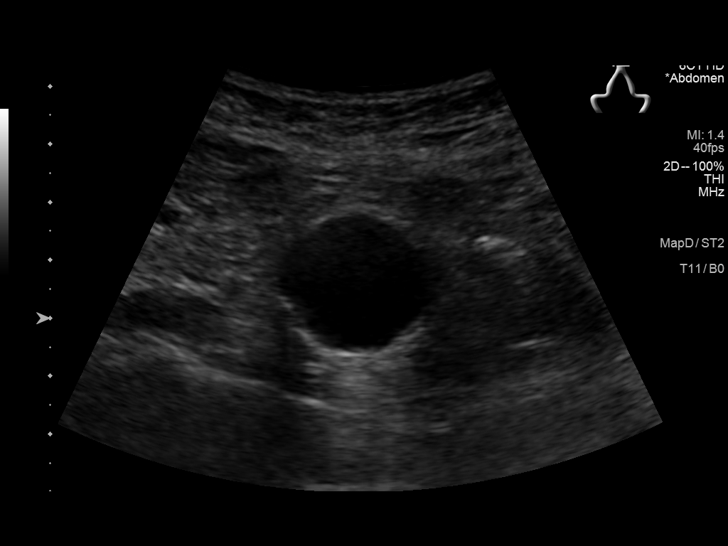
[im 22/41]
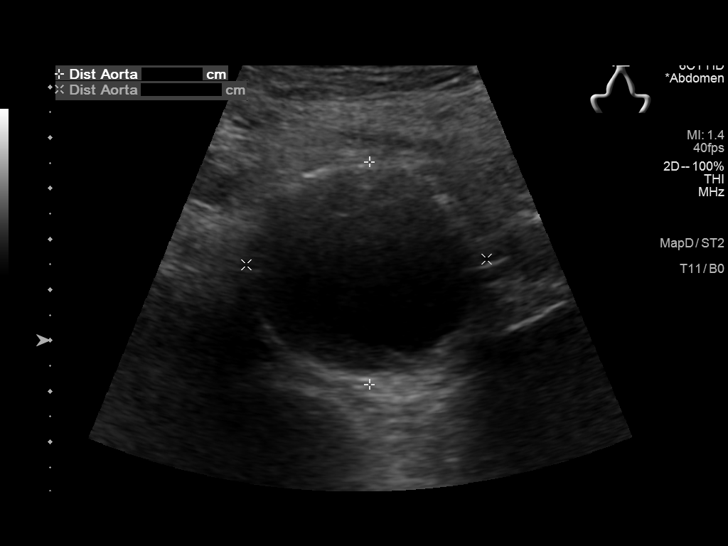
[im 26/41]
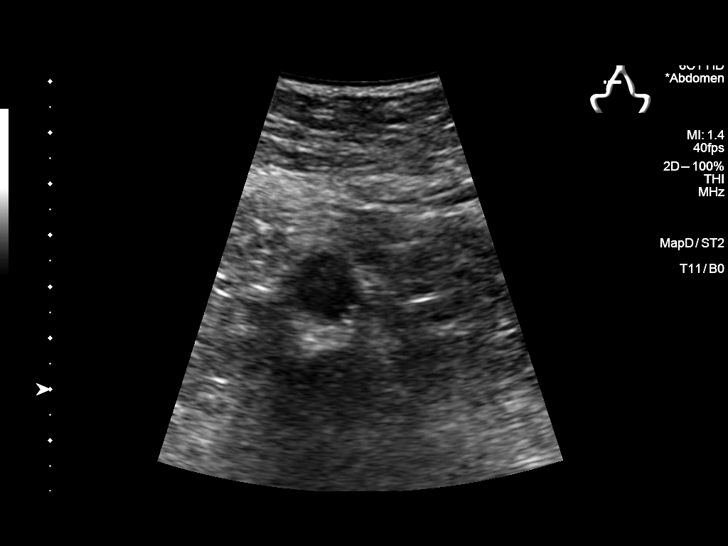
[im 27/41]
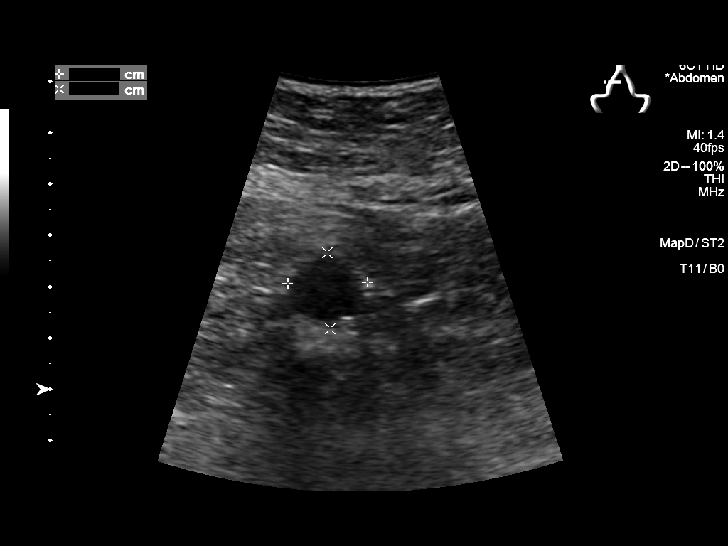
[im 31/41]
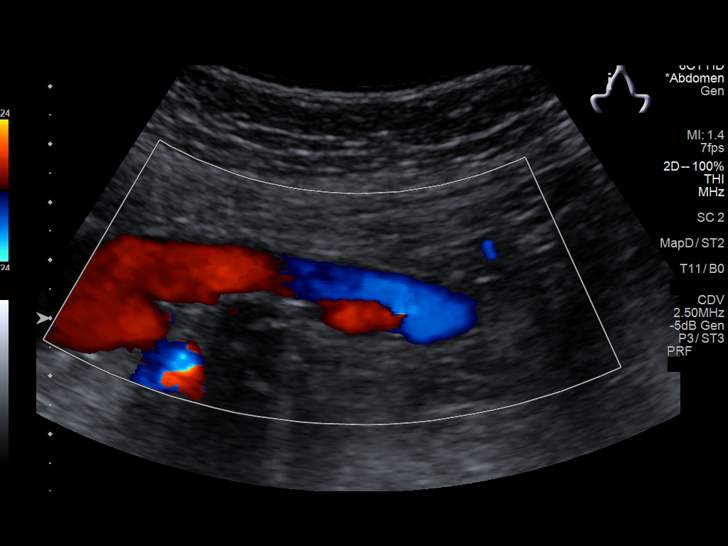
[im 34/41]
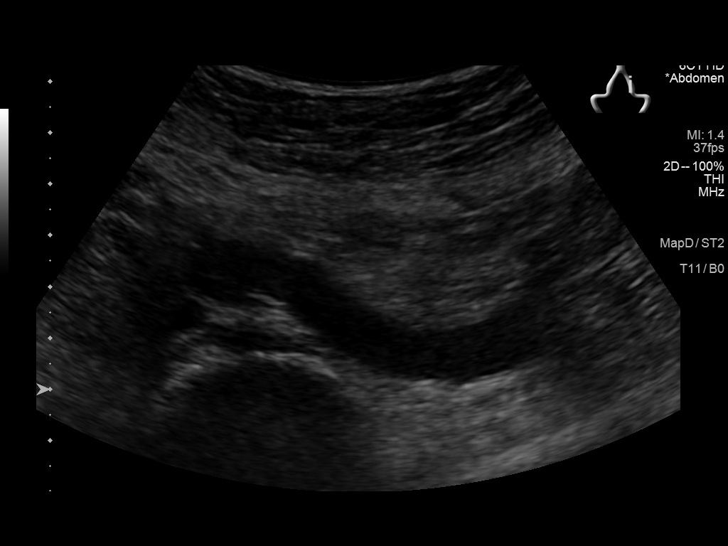
[im 37/41]
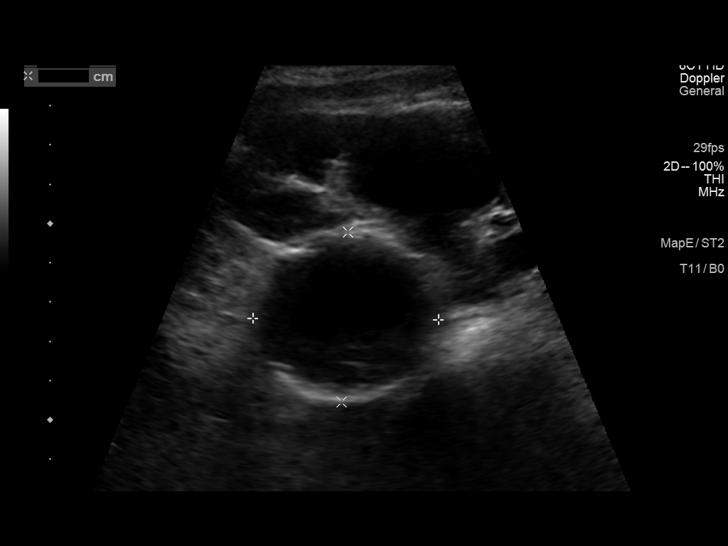
[im 41/41]
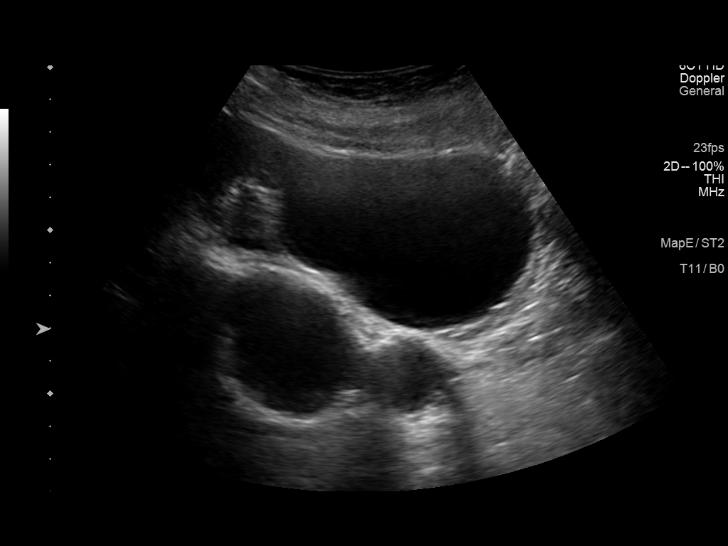

[14 of 25 positions shown; findings below may reference images not displayed]

FINDINGS: Abdominal aortic measurements as follows:

Proximal:  2.3 x 2.5 cm

Mid:  2.3 x 2.6 cm

Distal: 4.4 x 4.7 cm, with mural thrombus. Distal to the aneurysm,
the aorta measures 2.5 x 2.8 cm
Patent: Yes, peak systolic velocity is 45 cm/s

Right common iliac artery: 1.4 x 1.6 cm

Left common iliac artery: 1.2 x 1.3 cm

Plaque is noted throughout the abdominal aorta.
IMPRESSION: 1. Infrarenal abdominal aortic aneurysm with mural thrombus, which
measures up to 4.4 x 4.7 cm. Recommend follow-up CT/MR every 6
months and vascular consultation. This recommendation follows ACR
consensus guidelines: White Paper of the ACR Incidental Findings
Committee II on Vascular Findings. [HOSPITAL] 5792;
2. Ectasia of the right common iliac artery, which measures up to
1.6 cm.
